# Patient Record
Sex: Male | Born: 2000 | Race: White | Hispanic: No | Marital: Single | Smoking: Never smoker
Health system: Southern US, Community
[De-identification: ages and names within clinical notes are randomized; demographics above are authoritative.]

## PROBLEM LIST (undated history)

## (undated) DIAGNOSIS — R159 Full incontinence of feces: Secondary | ICD-10-CM

## (undated) DIAGNOSIS — R4689 Other symptoms and signs involving appearance and behavior: Secondary | ICD-10-CM

## (undated) DIAGNOSIS — Q2381 Bicuspid aortic valve: Secondary | ICD-10-CM

## (undated) DIAGNOSIS — R32 Unspecified urinary incontinence: Secondary | ICD-10-CM

## (undated) DIAGNOSIS — I77810 Thoracic aortic ectasia: Secondary | ICD-10-CM

## (undated) DIAGNOSIS — R251 Tremor, unspecified: Secondary | ICD-10-CM

## (undated) DIAGNOSIS — I351 Nonrheumatic aortic (valve) insufficiency: Secondary | ICD-10-CM

## (undated) DIAGNOSIS — Z8659 Personal history of other mental and behavioral disorders: Secondary | ICD-10-CM

## (undated) DIAGNOSIS — D509 Iron deficiency anemia, unspecified: Secondary | ICD-10-CM

## (undated) DIAGNOSIS — Q231 Congenital insufficiency of aortic valve: Secondary | ICD-10-CM

## (undated) DIAGNOSIS — F918 Other conduct disorders: Secondary | ICD-10-CM

## (undated) DIAGNOSIS — F73 Profound intellectual disabilities: Secondary | ICD-10-CM

## (undated) DIAGNOSIS — F840 Autistic disorder: Secondary | ICD-10-CM

## (undated) HISTORY — DX: Other symptoms and signs involving appearance and behavior: R46.89

## (undated) HISTORY — DX: Profound intellectual disabilities: F73

## (undated) HISTORY — DX: Iron deficiency anemia, unspecified: D50.9

## (undated) HISTORY — DX: Bicuspid aortic valve: Q23.81

## (undated) HISTORY — DX: Nonrheumatic aortic (valve) insufficiency: I35.1

## (undated) HISTORY — DX: Autistic disorder: F84.0

## (undated) HISTORY — DX: Thoracic aortic ectasia: I77.810

## (undated) HISTORY — DX: Personal history of other mental and behavioral disorders: Z86.59

## (undated) HISTORY — DX: Other conduct disorders: F91.8

## (undated) HISTORY — DX: Full incontinence of feces: R15.9

## (undated) HISTORY — DX: Unspecified urinary incontinence: R32

## (undated) HISTORY — DX: Tremor, unspecified: R25.1

## (undated) HISTORY — DX: Congenital insufficiency of aortic valve: Q23.1

---
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
## 2022-05-06 ENCOUNTER — Ambulatory Visit: Payer: Medicaid Other | Attending: Cardiology | Admitting: Cardiology

## 2022-05-06 ENCOUNTER — Ambulatory Visit (HOSPITAL_BASED_OUTPATIENT_CLINIC_OR_DEPARTMENT_OTHER): Payer: Medicaid Other

## 2022-05-06 ENCOUNTER — Encounter: Payer: Self-pay | Admitting: Cardiology

## 2022-05-06 VITALS — BP 100/70 | HR 76 | Ht 69.0 in | Wt 150.0 lb

## 2022-05-06 DIAGNOSIS — Q231 Congenital insufficiency of aortic valve: Secondary | ICD-10-CM | POA: Insufficient documentation

## 2022-05-06 LAB — ECHOCARDIOGRAM COMPLETE
AR max vel: 1.87 cm2
AV Area VTI: 2.13 cm2
AV Area mean vel: 1.91 cm2
AV Mean grad: 8 mmHg
AV Peak grad: 14.7 mmHg
Ao pk vel: 1.92 m/s
Area-P 1/2: 5.02 cm2
Height: 69 in
P 1/2 time: 368 msec
S' Lateral: 2.7 cm
Weight: 2400 oz

## 2022-05-06 NOTE — Patient Instructions (Addendum)
Medication Instructions:  The current medical regimen is effective;  continue present plan and medications.  *If you need a refill on your cardiac medications before your next appointment, please call your pharmacy*  Testing/Procedures: Your physician has requested that you have an echocardiogram. Echocardiography is a painless test that uses sound waves to create images of your heart. It provides your doctor with information about the size and shape of your heart and how well your heart's chambers and valves are working. This procedure takes approximately one hour. There are no restrictions for this procedure. Please do NOT wear cologne, perfume, aftershave, or lotions (deodorant is allowed). Please arrive 15 minutes prior to your appointment time.  Follow-Up: At [facility], you and your health needs are our priority.  As part of our continuing mission to provide you with exceptional heart care, we have created designated Provider Care Teams.  These Care Teams include your primary Cardiologist (physician) and Advanced Practice Providers (APPs -  Physician Assistants and Nurse Practitioners) who all work together to provide you with the care you need, when you need it.  We recommend signing up for the patient portal called "MyChart".  Sign up information is provided on this After Visit Summary.  MyChart is used to connect with patients for Virtual Visits (Telemedicine).  Patients are able to view lab/test results, encounter notes, upcoming appointments, etc.  Non-urgent messages can be sent to your provider as well.   To learn more about what you can do with MyChart, go to [email or web address].    Your next appointment:   As needed or 5 years  Provider:   Dr Marlou Porch

## 2022-05-06 NOTE — Progress Notes (Signed)
Cardiology Office Note:    Date:  05/06/2022   ID:  Tim Harvey, DOB [birth year], MRN [id number]  PCP:  Wendie Simmer, [place] [facility] Cardiologist:  Candee Furbish, MD     Referring MD: Wendie Simmer, FNP    History of Present Illness:    Tim Harvey is a 22 y.o. male with learning disability, nonverbal, autism here for the evaluation of heart murmur at the request of Wendie Simmer.  Past medical history he has bicuspid aortic valve listed as well as mild aortic insufficiency.  Pediatric echocardiogram from 04/29/2019 at [facility] showed the following:  Summary:   1. Bicuspid aortic valve with doming in systole. Peak velocity across aortic valve 2.1 m/sec, peak gradient 18 mm Hg, mean gradient 8 mm Hg.   2. Mild aortic valve insufficiency.   3. Mild aortic valve stenosis.   4. Normal left ventricular cavity size and systolic function.   5. Normal right ventricular cavity size and systolic function.   6. Mildly dilated aortic sinuses of Valsalva.   7. Unable to measure ascending aorta at level of right pulmonary artery but visible ascending aorta is qualitatively dilated.   He has seen Dr. Riccardo Dubin in the past or at least had an echocardiogram read by him in 2020.  He is sitting with his head down in chair.  Does not appear to have any respiratory difficulties.  Caregivers are here with him.  Past Medical History:  Diagnosis Date   Aggression    Ascending aorta dilatation [facility])    Per [name] - mild, no meds or activity restriction needed    Autism    Bicuspid aortic valve    Per Cardiology - no medication or activity restrictions    Conduct disorder with destruction of property    Encopresis    Enuresis    History of pica    Iron deficiency anemia    Mild aortic insufficiency    Profound intellectual disability    Tremor     No past surgical history on file.  Current Medications: Current Meds  Medication Sig   azelastine  (ASTELIN) 0.1 % nasal spray Place 2 sprays into both nostrils 2 (two) times daily.   benztropine (COGENTIN) 0.5 MG tablet Take 0.5 mg by mouth 2 (two) times daily.    cetirizine HCl (CETIRIZINE HCL CHILDRENS ALRGY) 5 MG/5ML SOLN Take 10 mLs (10 mg total) by mouth at bedtime.   chlorproMAZINE (THORAZINE) 50 MG tablet Take 50 mg by mouth 3 (three) times daily.   clindamycin-benzoyl peroxide (BENZACLIN) gel Apply 1 application topically See admin instructions. Apply to acne on face twice a day and decrease to once a day if face becomes dry   diazepam (VALIUM) 5 MG tablet 5 mg. Take 1 1/2 tablets by mouth 30 minutes prior to Doctors or lab appointments   LORazepam (ATIVAN) 1 MG tablet Take 1 mg by mouth every 8 (eight) hours.   mirtazapine (REMERON) 15 MG tablet Take 30 mg by mouth at bedtime.   montelukast (SINGULAIR) 5 MG chewable tablet Chew 5 mg by mouth at bedtime.   multivitamin-iron-minerals-folic acid (CENTRUM) chewable tablet Chew 1 tablet by mouth daily.   Polyethylene Glycol 3350 (PEG 3350) 17 GM/SCOOP POWD Take by mouth.   risperiDONE (RISPERDAL) 1 MG tablet Take 1 mg by mouth 3 (three) times daily.   [DISCONTINUED] risperiDONE (RISPERDAL) 1 MG/ML oral solution Take 1 mg by mouth 3 (three) times daily.     Allergies:  Gluten meal   Social History   Socioeconomic History   Marital status: Single    Spouse name: Not on file   Number of children: Not on file   Years of education: Not on file   Highest education level: Not on file  Occupational History   Not on file  Tobacco Use   Smoking status: Never   Smokeless tobacco: Never  Substance and Sexual Activity   Alcohol use: Never   Drug use: Never   Sexual activity: Never  Other Topics Concern   Not on file  Social History Narrative   Currently lives in [place] (part of [facility] in [place], [place]) with 2 other group home clients.        [place] is in the  11th grade.   He attends [employer].   Social Determinants of Health   Financial Resource Strain: Not on file  Food Insecurity: Not on file  Transportation Needs: Not on file  Physical Activity: Not on file  Stress: Not on file  Social Connections: Not on file     Family History: The patient's Family history is unknown by patient.  ROS:   Please see the history of present illness.       EKGs/Labs/Other Studies Reviewed:    The following studies were reviewed today: Other notes as above.  EKG: No EKG today  Recent Labs: No results found for requested labs within last 365 days.  Recent Lipid Panel No results found for: "CHOL", "TRIG", "HDL", "CHOLHDL", "VLDL", "LDLCALC", "LDLDIRECT"   Risk Assessment/Calculations:              Physical Exam:    VS:  BP 100/70   Pulse 76   Ht 5\' 9"  (1.753 m)   Wt 150 lb (68 kg)   BMI 22.15 kg/m     Wt Readings from Last 3 Encounters:  05/06/22 150 lb (68 kg)  03/28/19 100 lb (45.4 kg) (<1 %, Z= -2.72)*  03/08/19 109 lb (49.4 kg) (2 %, Z= -1.96)*   * Growth percentiles are based on CDC (Boys, 2-20 Years) data.     GEN: Thin, sitting in chair, head down nonverbal HEENT: Normal NECK: No JVD; No carotid bruits LYMPHATICS: No lymphadenopathy CARDIAC: RRR, 2/6 systolic as well as diastolic murmur,no rubs, gallops RESPIRATORY:  Clear to auscultation without rales, wheezing or rhonchi  ABDOMEN: Soft, non-tender, non-distended MUSCULOSKELETAL:  No edema; No deformity  SKIN: Warm and dry NEUROLOGIC:  Alert  PSYCHIATRIC: Autistic  ASSESSMENT:    1. Bicuspid aortic valve    PLAN:    In order of problems listed above:  Bicuspid aortic valve with aortic regurgitation in the setting of nonverbal autism -Prior echocardiogram reviewed as above in 2021.  We are repeating today.  Preliminarily images reveal moderate aortic regurgitation.  There is no significant aortic stenosis associated with his bicuspid valve.   Murmur heard on exam. - I would like for him to repeat echocardiogram in 3 years or sooner if symptoms develop such as shortness of breath.  Discussed with caregivers. - Challenging office visit, thankfully with premedications he was able to tolerate ultrasound.           Medication Adjustments/Labs and Tests Ordered: Current medicines are reviewed at length with the patient today.  Concerns regarding medicines are outlined above.  Orders Placed This Encounter  Procedures   ECHOCARDIOGRAM COMPLETE   No orders  of the defined types were placed in this encounter.   Patient Instructions  Medication Instructions:  The current medical regimen is effective;  continue present plan and medications.  *If you need a refill on your cardiac medications before your next appointment, please call your pharmacy*  Testing/Procedures: Your physician has requested that you have an echocardiogram. Echocardiography is a painless test that uses sound waves to create images of your heart. It provides your doctor with information about the size and shape of your heart and how well your heart's chambers and valves are working. This procedure takes approximately one hour. There are no restrictions for this procedure. Please do NOT wear cologne, perfume, aftershave, or lotions (deodorant is allowed). Please arrive 15 minutes prior to your appointment time.  Follow-Up: At [facility], you and your health needs are our priority.  As part of our continuing mission to provide you with exceptional heart care, we have created designated Provider Care Teams.  These Care Teams include your primary Cardiologist (physician) and Advanced Practice Providers (APPs -  Physician Assistants and Nurse Practitioners) who all work together to provide you with the care you need, when you need it.  We recommend signing up for the patient portal called "MyChart".  Sign up information is provided on this After Visit  Summary.  MyChart is used to connect with patients for Virtual Visits (Telemedicine).  Patients are able to view lab/test results, encounter notes, upcoming appointments, etc.  Non-urgent messages can be sent to your provider as well.   To learn more about what you can do with MyChart, go to [email or web address].    Your next appointment:   As needed or 5 years  Provider:   Dr Marlou Porch        Signed, Candee Furbish, MD  05/06/2022 1:29 PM    [place]
